# Patient Record
Sex: Male | Born: 1985 | Marital: Married
Health system: Southern US, Community
[De-identification: ages and names within clinical notes are randomized; demographics above are authoritative.]

---

## 2013-08-10 ENCOUNTER — Other Ambulatory Visit: Payer: Self-pay | Admitting: Internal Medicine

## 2013-08-10 ENCOUNTER — Ambulatory Visit
Admission: RE | Admit: 2013-08-10 | Discharge: 2013-08-10 | Disposition: A | Discharge status: Home / Self Care | Payer: No Typology Code available for payment source | Source: Ambulatory Visit | Attending: Internal Medicine | Admitting: Internal Medicine

## 2013-08-10 DIAGNOSIS — M25562 Pain in left knee: Secondary | ICD-10-CM

## 2013-08-10 DIAGNOSIS — M25462 Effusion, left knee: Secondary | ICD-10-CM

## 2014-09-04 IMAGING — CR DG KNEE 1-2V*L*
2 series · 2 of 2 positions shown · non-contrast
Comparison: None.

CLINICAL DATA: Left knee pain without injury.

EXAM:
LEFT KNEE - 1-2 VIEW

[view not recorded (1 of 2)]
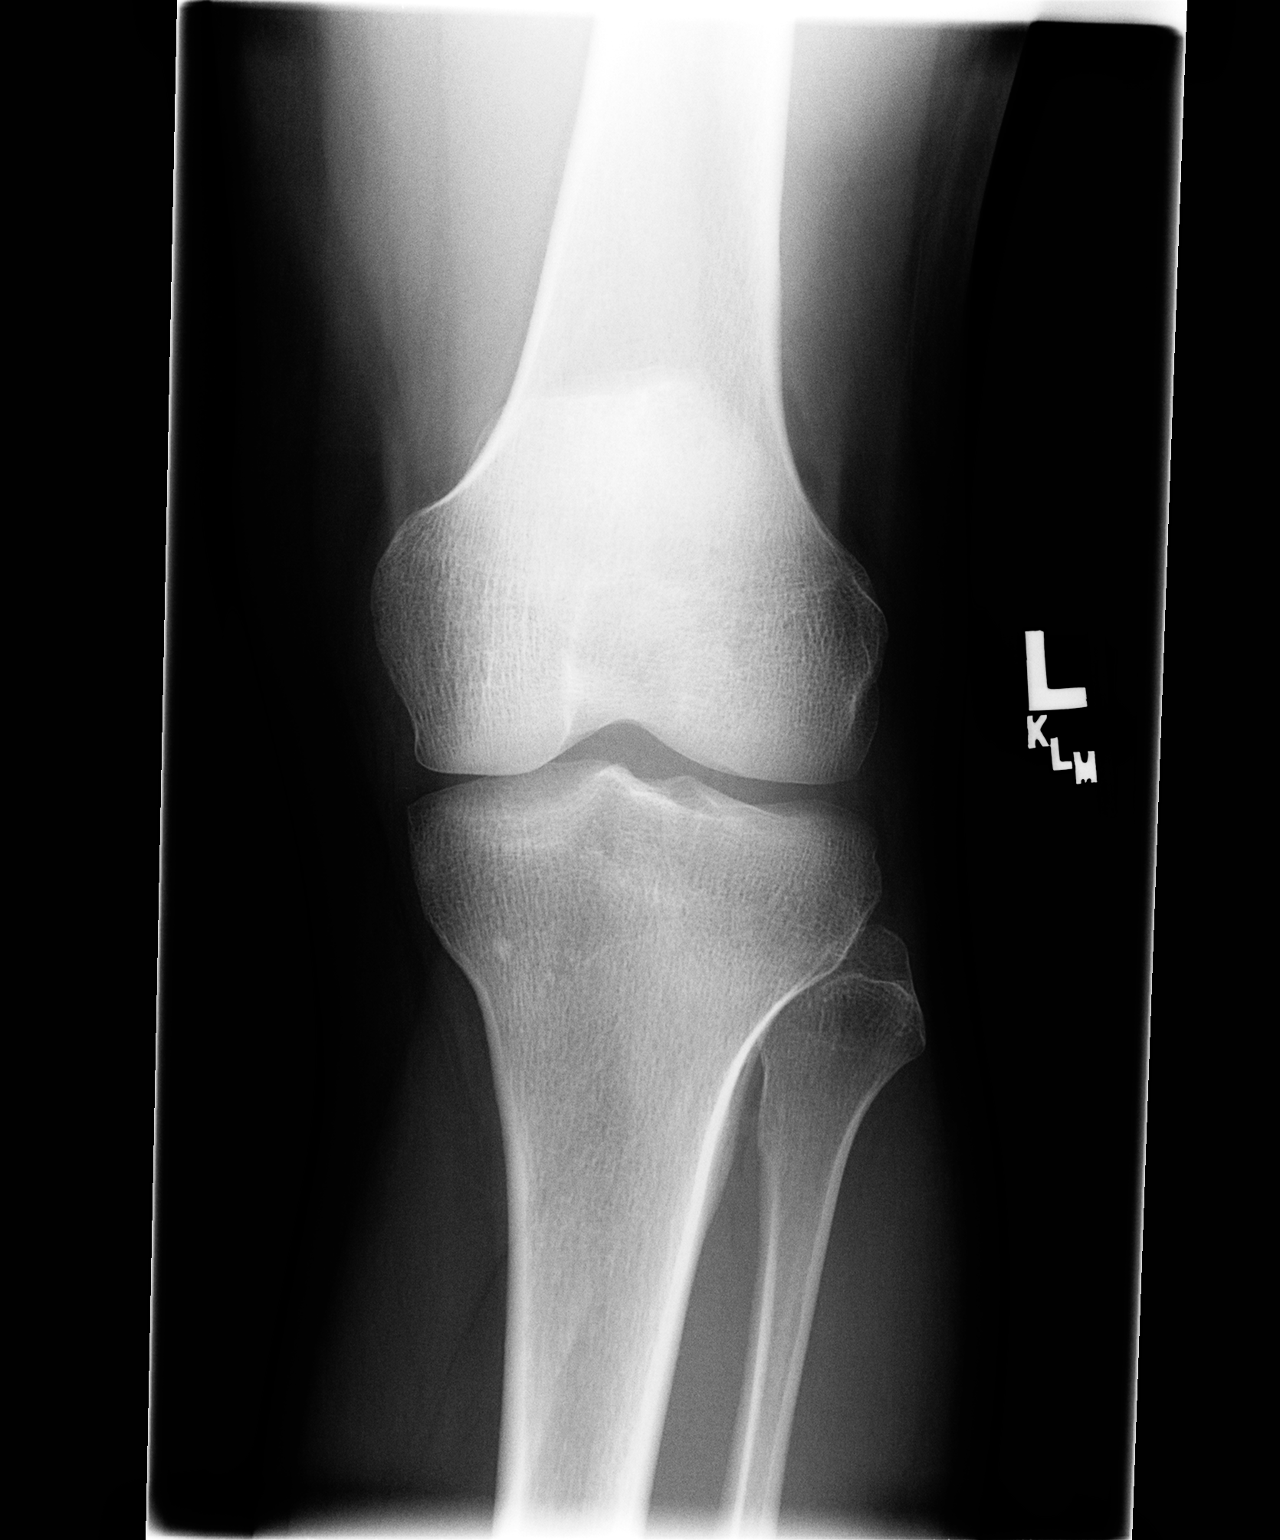

[view not recorded (2 of 2)]
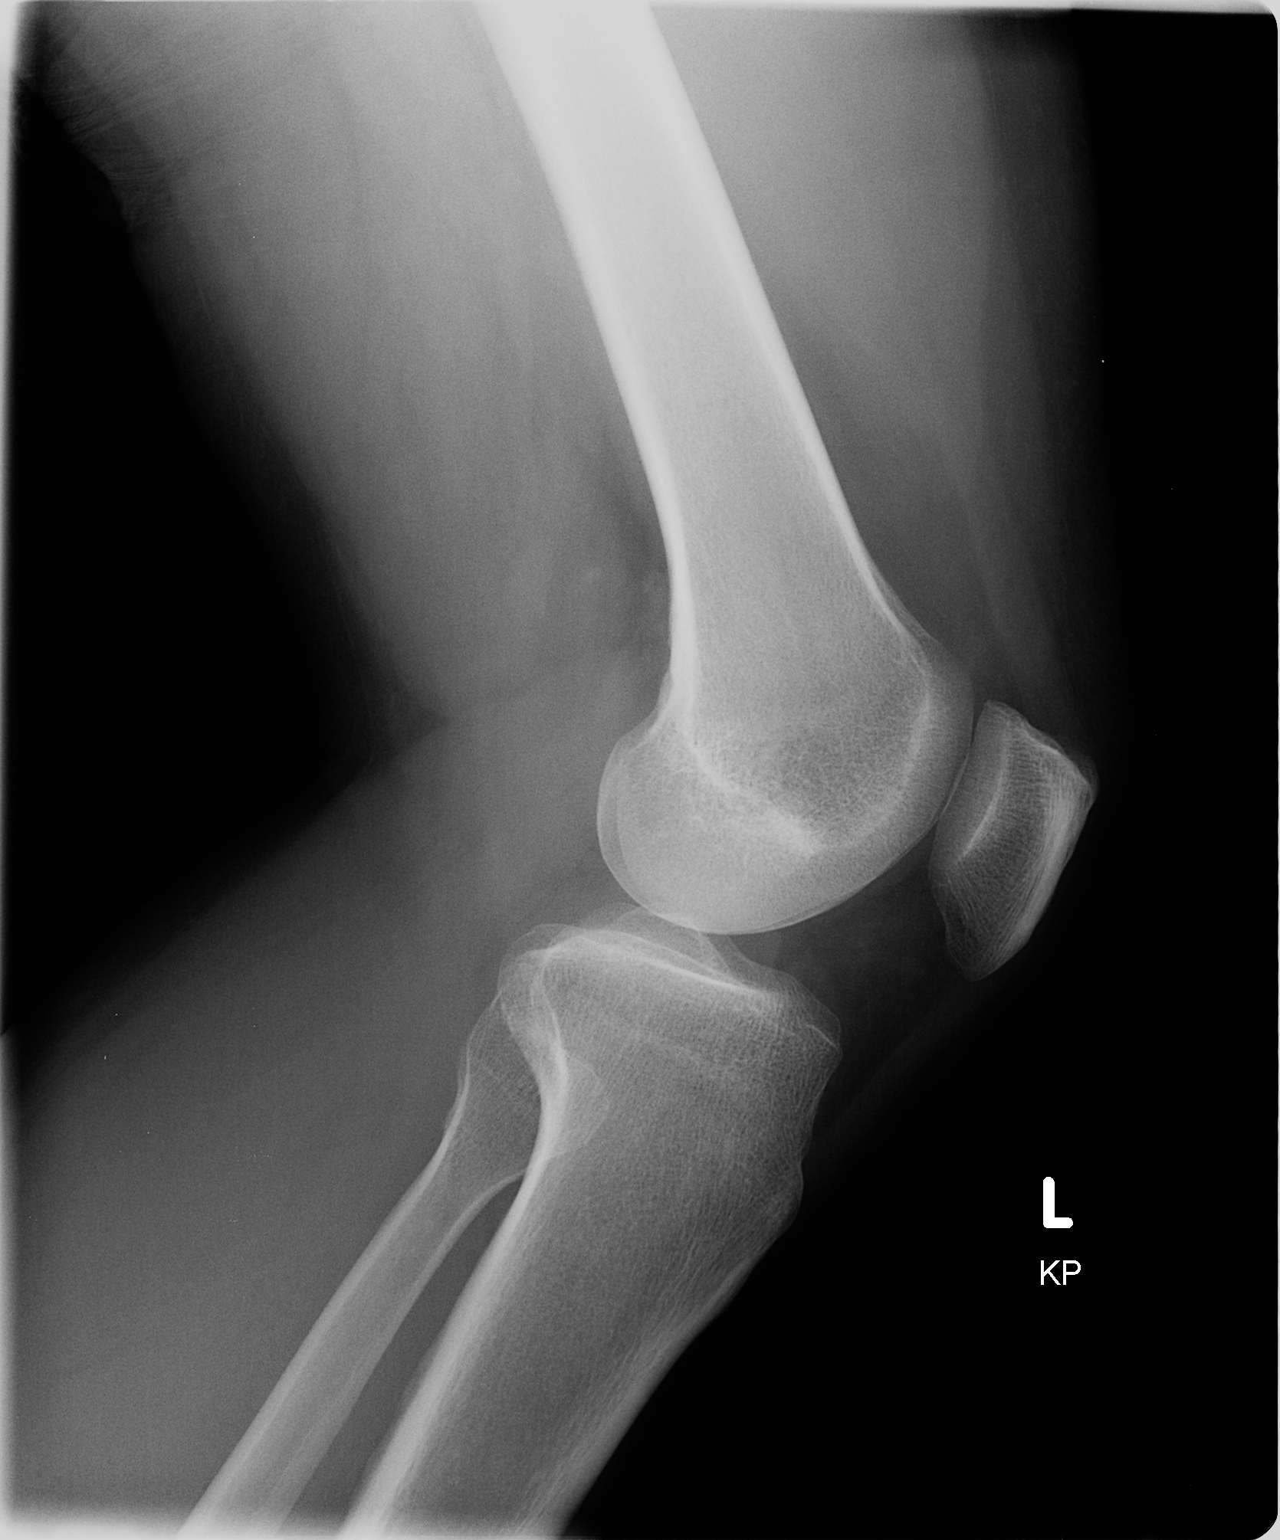

[2 of 2 positions shown; findings below may reference images not displayed]

FINDINGS: There is no evidence of fracture, dislocation, or joint effusion.
There is no evidence of arthropathy or other focal bone abnormality.
Soft tissues are unremarkable.
IMPRESSION: Normal left knee.
